# Patient Record
Sex: Male | Born: 1985 | Race: Black or African American | Hispanic: No | Marital: Single | Smoking: Former smoker
Health system: Southern US, Community
[De-identification: ages and names within clinical notes are randomized; demographics above are authoritative.]

## PROBLEM LIST (undated history)

## (undated) DIAGNOSIS — E079 Disorder of thyroid, unspecified: Secondary | ICD-10-CM

## (undated) DIAGNOSIS — C801 Malignant (primary) neoplasm, unspecified: Secondary | ICD-10-CM

## (undated) HISTORY — DX: Malignant (primary) neoplasm, unspecified: C80.1

## (undated) HISTORY — DX: Disorder of thyroid, unspecified: E07.9

---

## 2005-11-21 ENCOUNTER — Emergency Department: Payer: Self-pay | Admitting: Unknown Physician Specialty

## 2005-11-23 ENCOUNTER — Emergency Department: Payer: Self-pay | Admitting: Emergency Medicine

## 2005-11-24 ENCOUNTER — Emergency Department: Payer: Self-pay | Admitting: Unknown Physician Specialty

## 2005-11-28 ENCOUNTER — Emergency Department: Payer: Self-pay | Admitting: Emergency Medicine

## 2005-12-05 ENCOUNTER — Emergency Department: Payer: Self-pay | Admitting: Emergency Medicine

## 2005-12-20 ENCOUNTER — Emergency Department: Payer: Self-pay | Admitting: Emergency Medicine

## 2006-03-10 ENCOUNTER — Inpatient Hospital Stay: Payer: Self-pay | Admitting: Specialist

## 2006-03-21 ENCOUNTER — Ambulatory Visit: Payer: Self-pay | Admitting: Specialist
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
## 2008-06-28 ENCOUNTER — Emergency Department: Payer: Self-pay | Admitting: Emergency Medicine

## 2010-02-23 IMAGING — CR RIGHT FOOT COMPLETE - 3+ VIEW
1 series · 3 of 3 positions shown · non-contrast
Comparison: none

REASON FOR EXAM: injury
COMMENTS:

PROCEDURE:     DXR - DXR FOOT RT COMPLETE W/OBLIQUES  - June 28, 2008 [DATE]
RESULT:     Three views of the right foot reveal the bones to be adequately
mineralized. I do not see evidence of an acute displaced fracture. The site
of the patient's symptoms is not known to me, however.

[Series 1: view not recorded · 0.17mm/px · 3 of 3 slices shown]
[im 1/3]
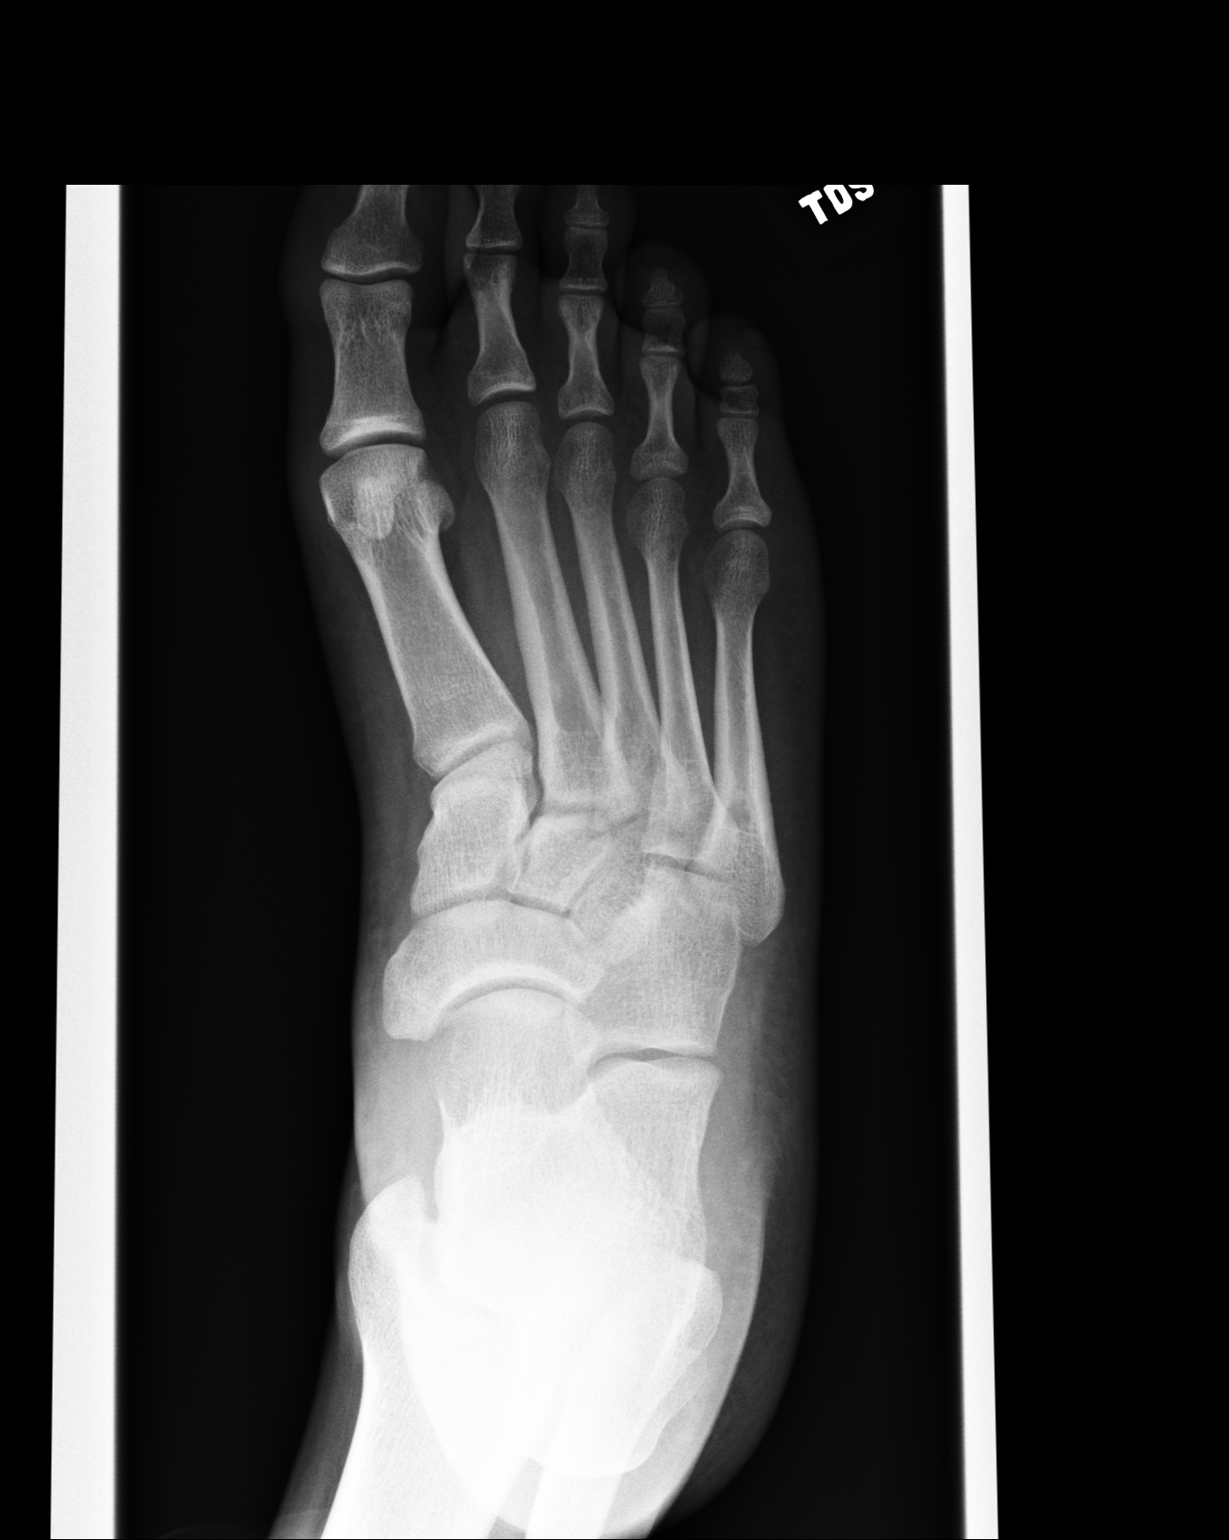
[im 2/3]
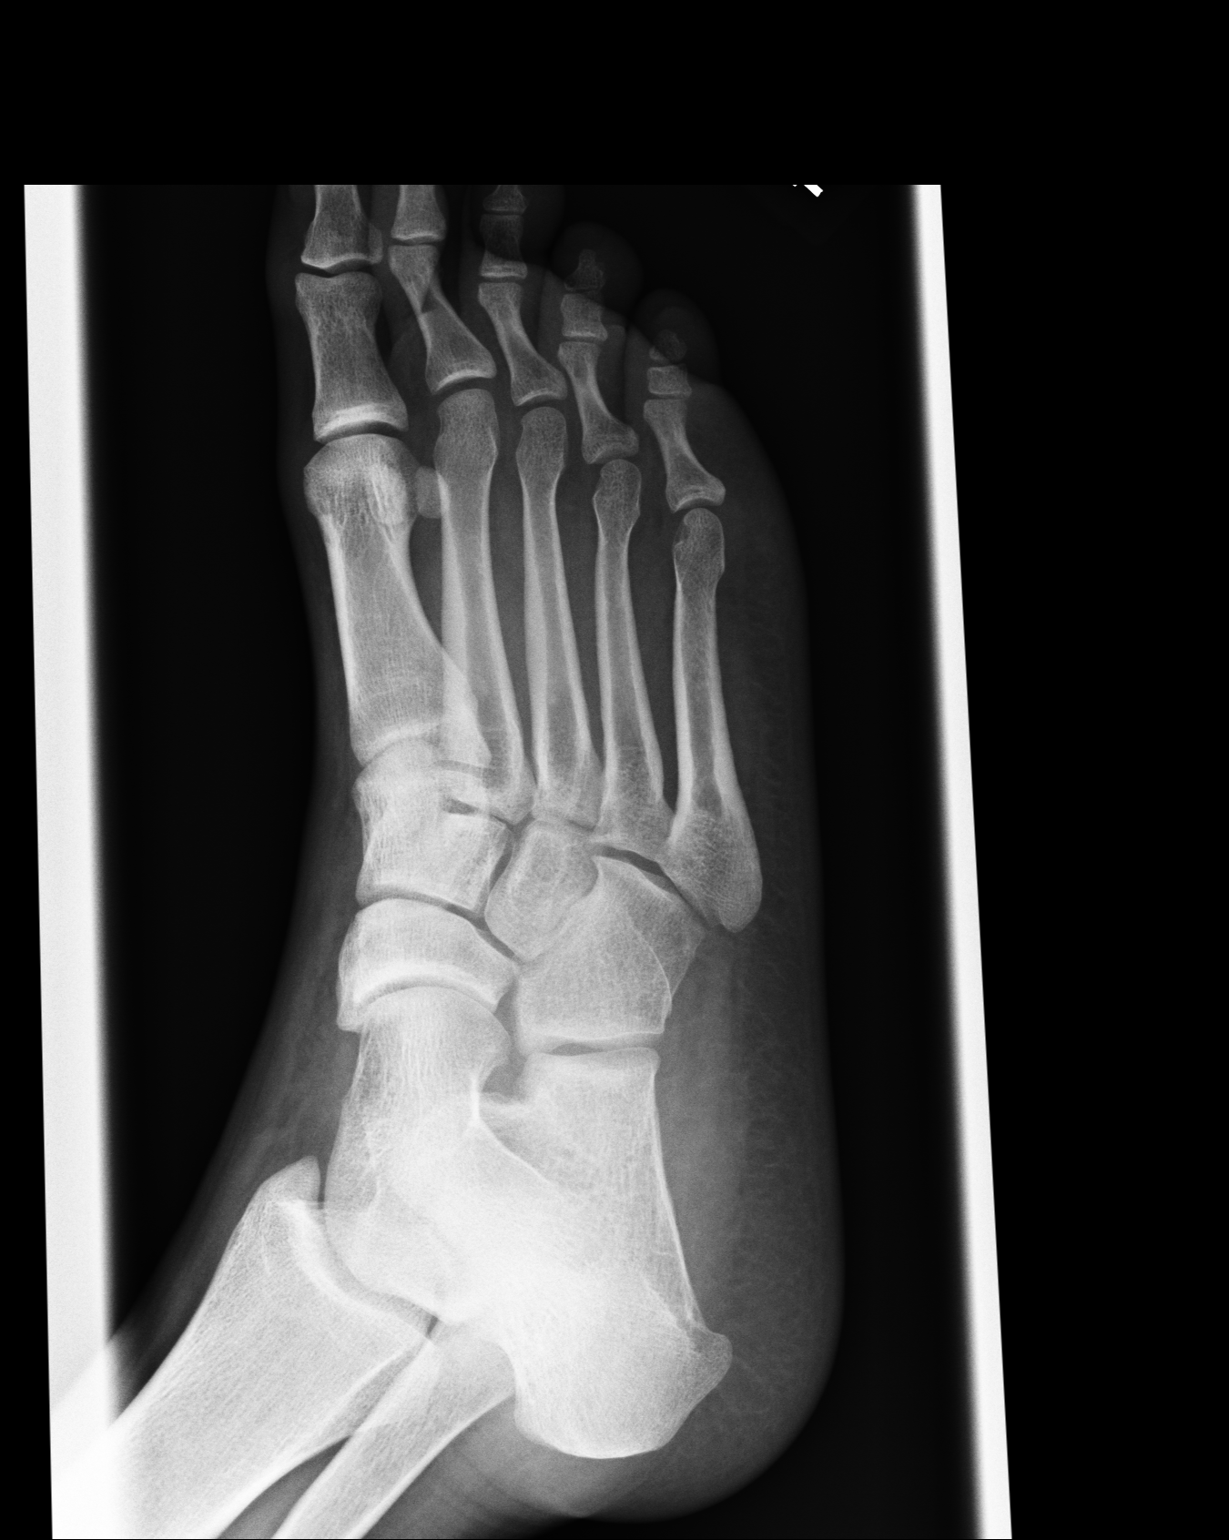
[im 3/3]
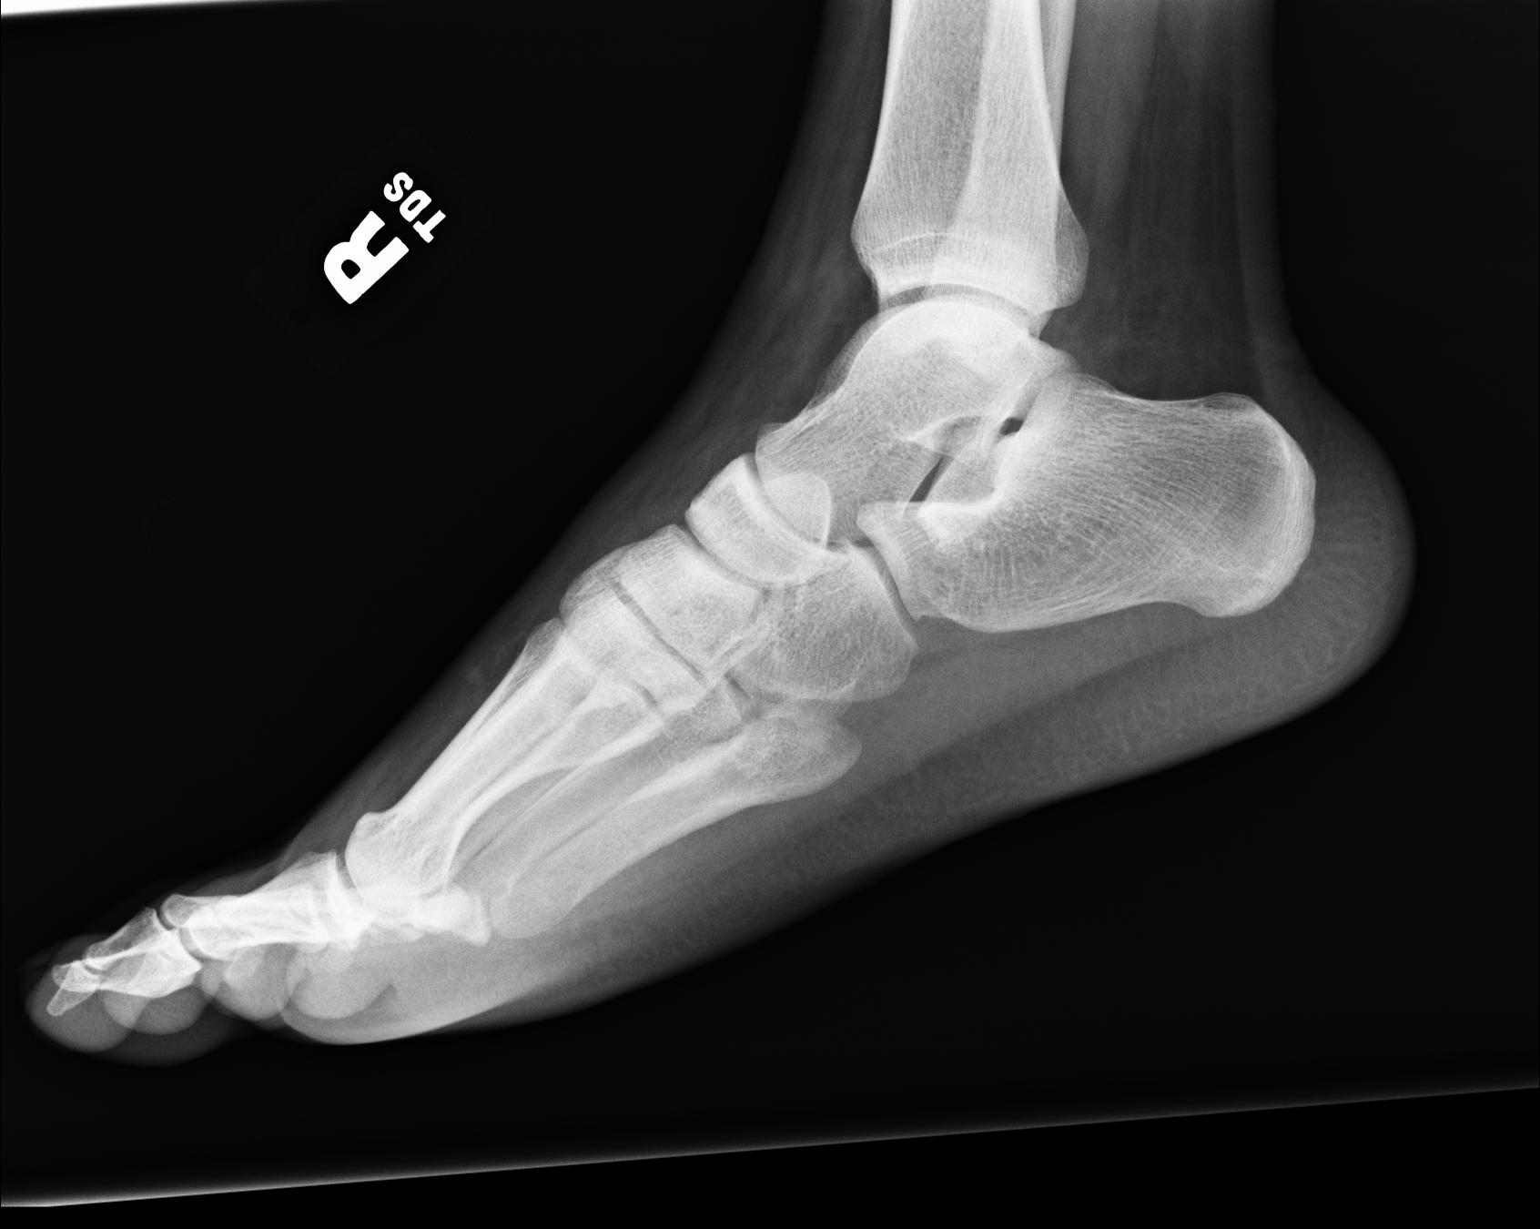

[3 of 3 positions shown; findings below may reference images not displayed]

IMPRESSION: I do not see objective evidence of an acute fracture of the
bones of the foot. Specific attention to the metatarsal bases reveals no
acute abnormality. Followup imaging coned to any area of persistent symptoms
would be useful. Further interpretation is deferred to Dr. Strnad.

## 2020-04-16 ENCOUNTER — Encounter: Payer: Self-pay | Admitting: Family Medicine

## 2020-04-16 ENCOUNTER — Ambulatory Visit (INDEPENDENT_AMBULATORY_CARE_PROVIDER_SITE_OTHER): Payer: Self-pay | Admitting: Family Medicine

## 2020-04-16 ENCOUNTER — Other Ambulatory Visit: Payer: Self-pay

## 2020-04-16 VITALS — BP 143/83 | HR 69 | Ht 67.0 in | Wt 203.6 lb

## 2020-04-16 DIAGNOSIS — Z7689 Persons encountering health services in other specified circumstances: Secondary | ICD-10-CM

## 2020-04-16 DIAGNOSIS — E0590 Thyrotoxicosis, unspecified without thyrotoxic crisis or storm: Secondary | ICD-10-CM

## 2020-04-16 MED ORDER — ATENOLOL 25 MG PO TABS: 25.0000 mg | ORAL_TABLET | Freq: Every day | ORAL | 30 refills | Status: DC

## 2020-04-16 MED ORDER — METHIMAZOLE 10 MG PO TABS: 10.0000 mg | ORAL_TABLET | Freq: Every day | ORAL | 3 refills | Status: AC

## 2020-04-16 NOTE — Assessment & Plan Note (Signed)
Will do TSH today. 

## 2020-04-16 NOTE — Assessment & Plan Note (Signed)
Plan- TSH today to evaluate levels.

## 2020-04-16 NOTE — Progress Notes (Signed)
Established Patient Office Visit  SUBJECTIVE:  Subjective  Patient ID: Kenneth Miller, male    DOB: February 07, 1986  Age: 35 y.o. MRN: [id number]  CC:  Chief Complaint  Patient presents with  . New Patient (Initial Visit)    HPI Kenneth Miller is a 35 y.o. male presenting today for    Past Medical History:  Diagnosis Date  . Cancer ([place])    skin   . Thyroid disease     History reviewed. No pertinent surgical history.  Family History  Problem Relation Age of Onset  . Cancer Father   . Stroke Father     Social History   Socioeconomic History  . Marital status: Single    Spouse name: Not on file  . Number of children: Not on file  . Years of education: Not on file  . Highest education level: Not on file  Occupational History  . Not on file  Tobacco Use  . Smoking status: Former Research scientist (life sciences)  . Smokeless tobacco: Never Used  Substance and Sexual Activity  . Alcohol use: Yes  . Drug use: Yes    Types: Marijuana, MDMA (Ecstacy)  . Sexual activity: Yes  Other Topics Concern  . Not on file  Social History Narrative  . Not on file   Social Determinants of Health   Financial Resource Strain: Not on file  Food Insecurity: Not on file  Transportation Needs: Not on file  Physical Activity: Not on file  Stress: Not on file  Social Connections: Not on file  Intimate Partner Violence: Not on file     Current Outpatient Medications:  .  atenolol (TENORMIN) 25 MG tablet, Take 25 mg by mouth daily., Disp: , Rfl:  .  methimazole (TAPAZOLE) 10 MG tablet, Take 10 mg by mouth daily., Disp: , Rfl:    No Known Allergies  ROS Review of Systems  Constitutional: Negative.   HENT: Negative.   Eyes: Negative.   Respiratory: Negative.   Cardiovascular: Negative.   Genitourinary: Negative.   Musculoskeletal: Negative.   Neurological: Negative.   Psychiatric/Behavioral: Negative.      OBJECTIVE:    Physical Exam Vitals and nursing note reviewed.  Constitutional:       Appearance: Normal appearance.  HENT:     Head: Normocephalic and atraumatic.     Nose: Nose normal.     Mouth/Throat:     Mouth: Mucous membranes are moist.  Cardiovascular:     Rate and Rhythm: Regular rhythm.  Musculoskeletal:        General: Normal range of motion.     Cervical back: Normal range of motion.  Skin:    General: Skin is warm.  Neurological:     General: No focal deficit present.     Mental Status: He is alert.  Psychiatric:        Mood and Affect: Mood normal.     BP (!) 143/83   Pulse 69   Ht _0  (1.702 m)   Wt 203 lb 9.6 oz (92.4 kg)   BMI 31.89 kg/m  Wt Readings from Last 3 Encounters:  04/16/20 203 lb 9.6 oz (92.4 kg)    Health Maintenance Due  Topic Date Due  . Hepatitis C Screening  Never done  . COVID-19 Vaccine (1) Never done  . HIV Screening  Never done  . TETANUS/TDAP  Never done  . INFLUENZA VACCINE  Never done    There are no preventive care reminders to display for this  patient.  No flowsheet data found. No flowsheet data found.  No results found for: TSH No results found for: ALBUMIN, ANIONGAP, EGFR, GFR No results found for: CHOL, HDL, LDLCALC, CHOLHDL No results found for: TRIG No results found for: HGBA1C    ASSESSMENT & PLAN:   Problem List Items Addressed This Visit      Endocrine   Hyperthyroidism    Will do TSH today.       Relevant Medications   atenolol (TENORMIN) 25 MG tablet   methimazole (TAPAZOLE) 10 MG tablet     Other   Encounter to establish care - Primary    Plan- TSH today to evaluate levels.          No orders of the defined types were placed in this encounter.     Follow-up: No follow-ups on file.    Beckie Salts, [place] [address] [place], [place] [address]

## 2020-04-17 LAB — COMPREHENSIVE METABOLIC PANEL
AG Ratio: 1.6 (calc) (ref 1.0–2.5)
ALT: 15 U/L (ref 9–46)
AST: 15 U/L (ref 10–40)
Albumin: 4.5 g/dL (ref 3.6–5.1)
Alkaline phosphatase (APISO): 38 U/L (ref 36–130)
BUN: 17 mg/dL (ref 7–25)
CO2: 24 mmol/L (ref 20–32)
Calcium: 9.4 mg/dL (ref 8.6–10.3)
Chloride: 106 mmol/L (ref 98–110)
Creat: 1.28 mg/dL (ref 0.60–1.35)
Globulin: 2.8 g/dL (calc) (ref 1.9–3.7)
Glucose, Bld: 76 mg/dL (ref 65–99)
Potassium: 4.4 mmol/L (ref 3.5–5.3)
Sodium: 141 mmol/L (ref 135–146)
Total Bilirubin: 0.2 mg/dL (ref 0.2–1.2)
Total Protein: 7.3 g/dL (ref 6.1–8.1)

## 2020-04-17 LAB — TSH: TSH: <0.01 mIU/L — ABNORMAL LOW (ref 0.40–4.50)

## 2020-04-22 ENCOUNTER — Other Ambulatory Visit: Payer: Self-pay | Admitting: *Deleted

## 2020-04-22 MED ORDER — METHIMAZOLE 10 MG PO TABS: 15.0000 mg | ORAL_TABLET | Freq: Every day | ORAL | 3 refills | Status: DC

## 2020-04-22 NOTE — Progress Notes (Signed)
Established Patient Office Visit  SUBJECTIVE:  Subjective  Patient ID: Kenneth Miller, male    DOB: 12/11/1985  Age: 35 y.o. MRN: [id number]  CC:  Chief Complaint  Patient presents with  . New Patient (Initial Visit)    HPI Kenneth Miller is a 35 y.o. male presenting today for     Past Medical History:  Diagnosis Date  . Cancer ([place])    skin   . Thyroid disease     History reviewed. No pertinent surgical history.  Family History  Problem Relation Age of Onset  . Cancer Father   . Stroke Father     Social History   Socioeconomic History  . Marital status: Single    Spouse name: Not on file  . Number of children: Not on file  . Years of education: Not on file  . Highest education level: Not on file  Occupational History  . Not on file  Tobacco Use  . Smoking status: Former Research scientist (life sciences)  . Smokeless tobacco: Never Used  Substance and Sexual Activity  . Alcohol use: Yes  . Drug use: Yes    Types: Marijuana, MDMA (Ecstacy)  . Sexual activity: Yes  Other Topics Concern  . Not on file  Social History Narrative  . Not on file   Social Determinants of Health   Financial Resource Strain: Not on file  Food Insecurity: Not on file  Transportation Needs: Not on file  Physical Activity: Not on file  Stress: Not on file  Social Connections: Not on file  Intimate Partner Violence: Not on file     Current Outpatient Medications:  .  atenolol (TENORMIN) 25 MG tablet, Take 1 tablet (25 mg total) by mouth daily., Disp: 30 tablet, Rfl: 30 .  methimazole (TAPAZOLE) 10 MG tablet, Take 1 tablet (10 mg total) by mouth daily., Disp: 30 tablet, Rfl: 3   No Known Allergies  ROS Review of Systems  Constitutional: Negative.   HENT: Negative.   Eyes: Negative.   Respiratory: Negative.   Cardiovascular: Negative.   Gastrointestinal: Negative.   Genitourinary: Negative.   Musculoskeletal: Negative.   Psychiatric/Behavioral: Negative.      OBJECTIVE:    Physical  Exam Vitals and nursing note reviewed.  Constitutional:      Appearance: Normal appearance. He is obese.  HENT:     Right Ear: Tympanic membrane normal.     Left Ear: Tympanic membrane normal.     Mouth/Throat:     Mouth: Mucous membranes are moist.  Eyes:     Pupils: Pupils are equal, round, and reactive to light.  Cardiovascular:     Rate and Rhythm: Normal rate and regular rhythm.  Musculoskeletal:        General: Normal range of motion.     Cervical back: Normal range of motion.  Skin:    General: Skin is warm.  Psychiatric:        Mood and Affect: Mood normal.     BP (!) 143/83   Pulse 69   Ht '5\' 7"'  (1.702 m)   Wt 203 lb 9.6 oz (92.4 kg)   BMI 31.89 kg/m  Wt Readings from Last 3 Encounters:  04/16/20 203 lb 9.6 oz (92.4 kg)    Health Maintenance Due  Topic Date Due  . Hepatitis C Screening  Never done  . COVID-19 Vaccine (1) Never done  . HIV Screening  Never done  . TETANUS/TDAP  Never done  . INFLUENZA VACCINE  Never done  There are no preventive care reminders to display for this patient.  No flowsheet data found. CMP Latest Ref Rng & Units 04/16/2020  Glucose 65 - 99 mg/dL 76  BUN 7 - 25 mg/dL 17  Creatinine 0.60 - 1.35 mg/dL 1.28  Sodium 135 - 146 mmol/L 141  Potassium 3.5 - 5.3 mmol/L 4.4  Chloride 98 - 110 mmol/L 106  CO2 20 - 32 mmol/L 24  Calcium 8.6 - 10.3 mg/dL 9.4  Total Protein 6.1 - 8.1 g/dL 7.3  Total Bilirubin 0.2 - 1.2 mg/dL 0.2  AST 10 - 40 U/L 15  ALT 9 - 46 U/L 15    Lab Results  Component Value Date   TSH <0.01 (L) 04/16/2020   No results found for: ALBUMIN, ANIONGAP, EGFR, GFR No results found for: CHOL, HDL, LDLCALC, CHOLHDL No results found for: TRIG No results found for: HGBA1C    ASSESSMENT & PLAN:   Problem List Items Addressed This Visit      Endocrine   Hyperthyroidism    Will do TSH today.       Relevant Medications   methimazole (TAPAZOLE) 10 MG tablet   atenolol (TENORMIN) 25 MG tablet   Other  Relevant Orders   Comprehensive Metabolic Panel (CMET) (Completed)   TSH (Completed)     Other   Encounter to establish care - Primary    Plan- TSH today to evaluate levels.       Relevant Orders   Comprehensive Metabolic Panel (CMET) (Completed)      Meds ordered this encounter  Medications  . methimazole (TAPAZOLE) 10 MG tablet    Sig: Take 1 tablet (10 mg total) by mouth daily.    Dispense:  30 tablet    Refill:  3  . atenolol (TENORMIN) 25 MG tablet    Sig: Take 1 tablet (25 mg total) by mouth daily.    Dispense:  30 tablet    Refill:  30      Follow-up: No follow-ups on file.    Beckie Salts, [place] [address], [place], [place] [address]

## 2020-05-14 ENCOUNTER — Other Ambulatory Visit: Payer: Self-pay

## 2020-05-14 ENCOUNTER — Encounter: Payer: Self-pay | Admitting: Family Medicine

## 2020-05-14 ENCOUNTER — Ambulatory Visit (INDEPENDENT_AMBULATORY_CARE_PROVIDER_SITE_OTHER): Payer: Self-pay | Admitting: Family Medicine

## 2020-05-14 VITALS — BP 131/77 | HR 70 | Ht 67.0 in | Wt 206.1 lb

## 2020-05-14 DIAGNOSIS — E0590 Thyrotoxicosis, unspecified without thyrotoxic crisis or storm: Secondary | ICD-10-CM

## 2020-05-14 NOTE — Progress Notes (Signed)
Established Patient Office Visit  SUBJECTIVE:  Subjective  Patient ID: Kenneth Miller, male    DOB: 02/09/1986  Age: 35 y.o. MRN: [id number]  CC:  Chief Complaint  Patient presents with  . Follow-up    Lab results    HPI Kenneth Miller is a 35 y.o. male presenting today for     Past Medical History:  Diagnosis Date  . Cancer ([place])    skin   . Thyroid disease     History reviewed. No pertinent surgical history.  Family History  Problem Relation Age of Onset  . Cancer Father   . Stroke Father     Social History   Socioeconomic History  . Marital status: Single    Spouse name: Not on file  . Number of children: Not on file  . Years of education: Not on file  . Highest education level: Not on file  Occupational History  . Not on file  Tobacco Use  . Smoking status: Former Research scientist (life sciences)  . Smokeless tobacco: Never Used  Substance and Sexual Activity  . Alcohol use: Yes  . Drug use: Yes    Types: Marijuana, MDMA (Ecstacy)  . Sexual activity: Yes  Other Topics Concern  . Not on file  Social History Narrative  . Not on file   Social Determinants of Health   Financial Resource Strain: Not on file  Food Insecurity: Not on file  Transportation Needs: Not on file  Physical Activity: Not on file  Stress: Not on file  Social Connections: Not on file  Intimate Partner Violence: Not on file     Current Outpatient Medications:  .  methimazole (TAPAZOLE) 10 MG tablet, Take 1.5 tablets (15 mg total) by mouth daily at 2 PM., Disp: 90 tablet, Rfl: 3 .  atenolol (TENORMIN) 25 MG tablet, Take 1 tablet (25 mg total) by mouth daily., Disp: 30 tablet, Rfl: 30 .  methimazole (TAPAZOLE) 10 MG tablet, Take 1 tablet (10 mg total) by mouth daily., Disp: 30 tablet, Rfl: 3   No Known Allergies  ROS Review of Systems  Constitutional: Negative.   HENT: Negative.   Respiratory: Negative.   Cardiovascular: Negative.   Genitourinary: Negative.   Hematological: Negative.    Psychiatric/Behavioral: Negative.      OBJECTIVE:    Physical Exam Constitutional:      Appearance: He is obese.  HENT:     Right Ear: Tympanic membrane normal.     Left Ear: Tympanic membrane normal.  Cardiovascular:     Rate and Rhythm: Normal rate and regular rhythm.  Musculoskeletal:        General: Normal range of motion.  Psychiatric:        Mood and Affect: Mood normal.     BP 131/77   Pulse 70   Ht '5\' 7"'  (1.702 m)   Wt 206 lb 1.6 oz (93.5 kg)   BMI 32.28 kg/m  Wt Readings from Last 3 Encounters:  05/14/20 206 lb 1.6 oz (93.5 kg)  04/16/20 203 lb 9.6 oz (92.4 kg)    Health Maintenance Due  Topic Date Due  . Hepatitis C Screening  Never done  . COVID-19 Vaccine (1) Never done  . HIV Screening  Never done  . TETANUS/TDAP  Never done  . INFLUENZA VACCINE  Never done    There are no preventive care reminders to display for this patient.  No flowsheet data found. CMP Latest Ref Rng & Units 04/16/2020  Glucose 65 - 99  mg/dL 76  BUN 7 - 25 mg/dL 17  Creatinine 0.60 - 1.35 mg/dL 1.28  Sodium 135 - 146 mmol/L 141  Potassium 3.5 - 5.3 mmol/L 4.4  Chloride 98 - 110 mmol/L 106  CO2 20 - 32 mmol/L 24  Calcium 8.6 - 10.3 mg/dL 9.4  Total Protein 6.1 - 8.1 g/dL 7.3  Total Bilirubin 0.2 - 1.2 mg/dL 0.2  AST 10 - 40 U/L 15  ALT 9 - 46 U/L 15    Lab Results  Component Value Date   TSH <0.01 (L) 04/16/2020   No results found for: ALBUMIN, ANIONGAP, EGFR, GFR No results found for: CHOL, HDL, LDLCALC, CHOLHDL No results found for: TRIG No results found for: HGBA1C    ASSESSMENT & PLAN:   Problem List Items Addressed This Visit      Endocrine   Hyperthyroidism - Primary    Doing better since getting back on his Methamazole and Metoprolol.   Plan- Eventually want patient to see an endocrinologist but currently he does not have insurance.          No orders of the defined types were placed in this encounter.     Follow-up: No follow-ups on file.     Beckie Salts, [place] [address], [place], [place] [address]

## 2020-05-14 NOTE — Assessment & Plan Note (Signed)
Doing better since getting back on his Methamazole and Metoprolol.   Plan- Eventually want patient to see an endocrinologist but currently he does not have insurance.

## 2020-07-16 ENCOUNTER — Ambulatory Visit (INDEPENDENT_AMBULATORY_CARE_PROVIDER_SITE_OTHER): Payer: Self-pay | Admitting: Family Medicine

## 2021-01-04 ENCOUNTER — Other Ambulatory Visit: Payer: Self-pay | Admitting: Internal Medicine

## 2021-02-12 ENCOUNTER — Emergency Department
Admission: EM | Admit: 2021-02-12 | Discharge: 2021-02-12 | Disposition: A | Discharge status: Home / Self Care | Payer: Self-pay | Attending: Emergency Medicine | Admitting: Emergency Medicine

## 2021-02-12 ENCOUNTER — Other Ambulatory Visit: Payer: Self-pay

## 2021-02-12 DIAGNOSIS — Z85828 Personal history of other malignant neoplasm of skin: Secondary | ICD-10-CM | POA: Insufficient documentation

## 2021-02-12 DIAGNOSIS — Z87891 Personal history of nicotine dependence: Secondary | ICD-10-CM | POA: Insufficient documentation

## 2021-02-12 DIAGNOSIS — S0081XA Abrasion of other part of head, initial encounter: Secondary | ICD-10-CM | POA: Insufficient documentation

## 2021-02-12 DIAGNOSIS — Z0279 Encounter for issue of other medical certificate: Secondary | ICD-10-CM | POA: Insufficient documentation
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
## 2021-04-26 ENCOUNTER — Other Ambulatory Visit: Payer: Self-pay | Admitting: *Deleted

## 2021-04-26 MED ORDER — ATENOLOL 25 MG PO TABS
25.0000 mg | ORAL_TABLET | Freq: Every day | ORAL | 30 refills | Status: AC
Start: 2021-04-26 — End: ?
